# Patient Record
Sex: Male | Born: 1955 | Race: White | Hispanic: No | Marital: Married | Smoking: Never smoker
Health system: Southern US, Community
[De-identification: ages and names within clinical notes are randomized; demographics above are authoritative.]

## PROBLEM LIST (undated history)

## (undated) DIAGNOSIS — C9590 Leukemia, unspecified not having achieved remission: Secondary | ICD-10-CM

## (undated) HISTORY — PX: ARTHROSCOPY KNEE W/ DRILLING: SUR92

## (undated) HISTORY — PX: COLONOSCOPY: SHX174

---

## 2014-02-15 ENCOUNTER — Emergency Department (HOSPITAL_BASED_OUTPATIENT_CLINIC_OR_DEPARTMENT_OTHER)
Admission: EM | Admit: 2014-02-15 | Discharge: 2014-02-15 | Disposition: A | Discharge status: Home / Self Care | Payer: No Typology Code available for payment source | Attending: Emergency Medicine | Admitting: Emergency Medicine

## 2014-02-15 ENCOUNTER — Emergency Department (HOSPITAL_BASED_OUTPATIENT_CLINIC_OR_DEPARTMENT_OTHER): Payer: No Typology Code available for payment source

## 2014-02-15 ENCOUNTER — Encounter (HOSPITAL_BASED_OUTPATIENT_CLINIC_OR_DEPARTMENT_OTHER): Payer: Self-pay | Admitting: Emergency Medicine

## 2014-02-15 DIAGNOSIS — K529 Noninfective gastroenteritis and colitis, unspecified: Secondary | ICD-10-CM | POA: Diagnosis not present

## 2014-02-15 DIAGNOSIS — M791 Myalgia: Secondary | ICD-10-CM | POA: Insufficient documentation

## 2014-02-15 DIAGNOSIS — R197 Diarrhea, unspecified: Secondary | ICD-10-CM

## 2014-02-15 DIAGNOSIS — D696 Thrombocytopenia, unspecified: Secondary | ICD-10-CM | POA: Insufficient documentation

## 2014-02-15 DIAGNOSIS — Z7982 Long term (current) use of aspirin: Secondary | ICD-10-CM | POA: Insufficient documentation

## 2014-02-15 DIAGNOSIS — C9110 Chronic lymphocytic leukemia of B-cell type not having achieved remission: Secondary | ICD-10-CM | POA: Diagnosis not present

## 2014-02-15 HISTORY — DX: Leukemia, unspecified not having achieved remission: C95.90

## 2014-02-15 LAB — COMPREHENSIVE METABOLIC PANEL
ALT: 37 U/L (ref 0–53)
AST: 31 U/L (ref 0–37)
Albumin: 4.5 g/dL (ref 3.5–5.2)
Alkaline Phosphatase: 76 U/L (ref 39–117)
Anion gap: 6 (ref 5–15)
BUN: 21 mg/dL (ref 6–23)
CO2: 23 mmol/L (ref 19–32)
Calcium: 8.9 mg/dL (ref 8.4–10.5)
Chloride: 105 mmol/L (ref 96–112)
Creatinine, Ser: 0.96 mg/dL (ref 0.50–1.35)
GFR calc Af Amer: >90 mL/min (ref >90)
GFR calc non Af Amer: 88 mL/min — ABNORMAL LOW (ref >90)
Glucose, Bld: 156 mg/dL — ABNORMAL HIGH (ref 70–99)
POTASSIUM: 3.5 mmol/L (ref 3.5–5.1)
SODIUM: 134 mmol/L — AB (ref 135–145)
Total Bilirubin: 0.8 mg/dL (ref 0.3–1.2)
Total Protein: 7.5 g/dL (ref 6.0–8.3)

## 2014-02-15 LAB — CBC WITH DIFFERENTIAL/PLATELET
BASOS ABS: 0 10*3/uL (ref 0.0–0.1)
BASOS PCT: 0 % (ref 0–1)
Eosinophils Absolute: 0 10*3/uL (ref 0.0–0.7)
Eosinophils Relative: 0 % (ref 0–5)
HEMATOCRIT: 49 % (ref 39.0–52.0)
HEMOGLOBIN: 16.5 g/dL (ref 13.0–17.0)
Lymphocytes Relative: 52 % — ABNORMAL HIGH (ref 12–46)
Lymphs Abs: 16.3 10*3/uL — ABNORMAL HIGH (ref 0.7–4.0)
MCH: 30.1 pg (ref 26.0–34.0)
MCHC: 33.7 g/dL (ref 30.0–36.0)
MCV: 89.4 fL (ref 78.0–100.0)
MONOS PCT: 3 % (ref 3–12)
Monocytes Absolute: 0.9 10*3/uL (ref 0.1–1.0)
Neutro Abs: 14.1 10*3/uL — ABNORMAL HIGH (ref 1.7–7.7)
Neutrophils Relative %: 45 % (ref 43–77)
Platelets: 117 10*3/uL — ABNORMAL LOW (ref 150–400)
RBC: 5.48 MIL/uL (ref 4.22–5.81)
RDW: 13.7 % (ref 11.5–15.5)
WBC: 31.3 10*3/uL — ABNORMAL HIGH (ref 4.0–10.5)

## 2014-02-15 MED ORDER — SULFAMETHOXAZOLE-TRIMETHOPRIM 800-160 MG PO TABS: 1.0000 | ORAL_TABLET | Freq: Two times a day (BID) | ORAL | Status: DC

## 2014-02-15 MED ORDER — METRONIDAZOLE 500 MG PO TABS
500.0000 mg | ORAL_TABLET | Freq: Once | ORAL | Status: AC
  Administered 2014-02-15: 500 mg via ORAL
  Filled 2014-02-15: qty 1

## 2014-02-15 MED ORDER — DICYCLOMINE HCL 10 MG/ML IM SOLN
20.0000 mg | Freq: Once | INTRAMUSCULAR | Status: AC
  Administered 2014-02-15: 20 mg via INTRAMUSCULAR
  Filled 2014-02-15: qty 2

## 2014-02-15 MED ORDER — METRONIDAZOLE 500 MG PO TABS: 500.0000 mg | ORAL_TABLET | Freq: Three times a day (TID) | ORAL | Status: DC

## 2014-02-15 MED ORDER — SODIUM CHLORIDE 0.9 % IV BOLUS (SEPSIS)
1000.0000 mL | Freq: Once | INTRAVENOUS | Status: AC
  Administered 2014-02-15: 1000 mL via INTRAVENOUS

## 2014-02-15 MED ORDER — ACETAMINOPHEN 500 MG PO TABS
1000.0000 mg | ORAL_TABLET | Freq: Once | ORAL | Status: AC
  Administered 2014-02-15: 1000 mg via ORAL
  Filled 2014-02-15: qty 2

## 2014-02-15 MED ORDER — KETOROLAC TROMETHAMINE 30 MG/ML IJ SOLN
30.0000 mg | Freq: Once | INTRAMUSCULAR | Status: AC
  Administered 2014-02-15: 30 mg via INTRAVENOUS
  Filled 2014-02-15: qty 1

## 2014-02-15 MED ORDER — MORPHINE SULFATE 2 MG/ML IJ SOLN
2.0000 mg | Freq: Once | INTRAMUSCULAR | Status: AC
  Administered 2014-02-15: 2 mg via INTRAVENOUS
  Filled 2014-02-15: qty 1

## 2014-02-15 MED ORDER — IOHEXOL 300 MG/ML  SOLN
100.0000 mL | Freq: Once | INTRAMUSCULAR | Status: AC | PRN
  Administered 2014-02-15: 100 mL via INTRAVENOUS

## 2014-02-15 MED ORDER — IOHEXOL 300 MG/ML  SOLN
50.0000 mL | Freq: Once | INTRAMUSCULAR | Status: AC | PRN
  Administered 2014-02-15: 50 mL via ORAL

## 2014-02-15 MED ORDER — HYDROCODONE-ACETAMINOPHEN 5-325 MG PO TABS: 1.0000 | ORAL_TABLET | Freq: Four times a day (QID) | ORAL | Status: AC | PRN

## 2014-02-15 NOTE — ED Notes (Signed)
Second liter 0.9 nss just finishing 3rd liter infusing

## 2014-02-15 NOTE — ED Provider Notes (Addendum)
CSN: [id number]     Arrival date & time 02/15/14  0405 History   None    Chief Complaint  Patient presents with  . Diarrhea     (Consider location/radiation/quality/duration/timing/severity/associated sxs/prior Treatment) Patient is a 59 y.o. male presenting with diarrhea. The history is provided by the patient.  Diarrhea Quality:  Watery Severity:  Moderate Onset quality:  Gradual Timing:  Intermittent Progression:  Unchanged Relieved by:  Nothing Worsened by:  Nothing tried Ineffective treatments:  None tried Associated symptoms: myalgias   Associated symptoms: no fever and no vomiting   Risk factors: no recent antibiotic use and no travel to endemic areas     Past Medical History  Diagnosis Date  . Leukemia   . Leukemia chronic leukemia   Past Surgical History  Procedure Laterality Date  . Arthroscopy knee w/ drilling     History reviewed. No pertinent family history. History  Substance Use Topics  . Smoking status: Never Smoker   . Smokeless tobacco: Never Used  . Alcohol Use: Yes    Review of Systems  Constitutional: Negative for fever.  Respiratory: Negative for cough, shortness of breath and wheezing.   Cardiovascular: Negative for chest pain, palpitations and leg swelling.  Gastrointestinal: Positive for diarrhea. Negative for vomiting.  Genitourinary: Negative for dysuria, frequency, flank pain and difficulty urinating.  Musculoskeletal: Positive for myalgias.  All other systems reviewed and are negative.     Allergies  Avelox  Home Medications   Prior to Admission medications   Medication Sig Start Date End Date Taking? Authorizing Provider  aspirin 81 MG tablet Take 81 mg by mouth daily.   Yes Historical Provider, MD  tamsulosin (FLOMAX) 0.4 MG CAPS capsule Take 0.4 mg by mouth.   Yes Historical Provider, MD   BP 150/83 mmHg  Pulse 115  Temp(Src) 101.2 F (38.4 C) (Oral)  Resp 20  Ht 6\' 2"  (1.88 m)  Wt 230 lb (104.327 kg)  BMI 29.52  kg/m2  SpO2 96% Physical Exam  Constitutional: He is oriented to person, place, and time. He appears well-developed and well-nourished. No distress.  HENT:  Head: Normocephalic and atraumatic.  Mouth/Throat: Oropharynx is clear and moist. No oropharyngeal exudate.  Eyes: Conjunctivae and EOM are normal. Pupils are equal, round, and reactive to light.  Neck: Normal range of motion. Neck supple.  Cardiovascular: Normal rate, regular rhythm and intact distal pulses.   Pulmonary/Chest: Effort normal and breath sounds normal. No respiratory distress. He has no wheezes. He has no rales.  Abdominal: Soft. Bowel sounds are increased. There is no tenderness. There is no rebound and no guarding.  Musculoskeletal: Normal range of motion.  Neurological: He is alert and oriented to person, place, and time.  Skin: Skin is warm and dry.  Psychiatric: He has a normal mood and affect.    ED Course  Procedures (including critical care time) Labs Review Labs Reviewed  CBC WITH DIFFERENTIAL/PLATELET  COMPREHENSIVE METABOLIC PANEL    Imaging Review No results found.   EKG Interpretation None      MDM   Final diagnoses:  None    Medications  sodium chloride 0.9 % bolus 1,000 mL (1,000 mLs Intravenous New Bag/Given 02/15/14 0545)  sodium chloride 0.9 % bolus 1,000 mL (not administered)  acetaminophen (TYLENOL) tablet 1,000 mg (1,000 mg Oral Given 02/15/14 0501)  sodium chloride 0.9 % bolus 1,000 mL (1,000 mLs Intravenous New Bag/Given 02/15/14 0502)  dicyclomine (BENTYL) injection 20 mg (20 mg Intramuscular Given 02/15/14 0502)  morphine 2 MG/ML injection 2 mg (2 mg Intravenous Given 02/15/14 0545)  iohexol (OMNIPAQUE) 300 MG/ML solution 50 mL (50 mLs Oral Contrast Given 02/15/14 0510)  iohexol (OMNIPAQUE) 300 MG/ML solution 100 mL (100 mLs Intravenous Contrast Given 02/15/14 0554)  Patient did not originally tell EDP about his chronic leukemia, any surgeries or his recent colonoscopy     Patient has a h/o off Chronic leukemia this is of the white blood cell line making difficult to differentiate acute phase reactant of illness vs. Ongoing process.  Furthermore, suspect patient's diet has prolonged the diarrhea as patient has been eating pizza and [place] for the last 2 days which are both greasy and spicy.  Patient is well appearing.  In fact is main complaint is that he has not slept this evening.  We are treating his pain and free water loss with fluids.  No emesis.    Patient had a recent reportedly normal colonoscopy.  We will start flagyl for colitis and recommend close follow up including stool studies with your gastroenterologist.  Patient and wife verbalize understanding and agree to follow up  Will recommend bland diet and food choices to help alleviate diarrhea as well as yogurt with active cultures to slow diarrhea.  Recommend recheck with your doctor in 48 hours.      Carlisle Beers, MD 02/15/14 4037  Santino Kinsella K Amely Voorheis-Rasch, MD 02/15/14 5436  Carlisle Beers, MD 02/15/14 [phone]

## 2014-02-15 NOTE — ED Notes (Signed)
Wife to nurse station to question why pt was having CT abd and informed this Probation officer that her husband had recently had colonoscopy by Dr Truman Hayward at [place] or [place] endoscopy which they failed to mention during questioning at triage. They were asked to include all procedures when being triaged.

## 2014-02-15 NOTE — ED Notes (Signed)
Pt reports diarrhea stools for the last 2 days with greater than ten episodes in the last 24 hours also reports abd pain admits to nausea but denies having had emesis

## 2018-01-27 ENCOUNTER — Encounter (HOSPITAL_BASED_OUTPATIENT_CLINIC_OR_DEPARTMENT_OTHER): Payer: Self-pay | Admitting: Emergency Medicine

## 2018-01-27 ENCOUNTER — Emergency Department (HOSPITAL_BASED_OUTPATIENT_CLINIC_OR_DEPARTMENT_OTHER): Payer: Managed Care, Other (non HMO)

## 2018-01-27 ENCOUNTER — Emergency Department (HOSPITAL_BASED_OUTPATIENT_CLINIC_OR_DEPARTMENT_OTHER)
Admission: EM | Admit: 2018-01-27 | Discharge: 2018-01-27 | Disposition: A | Discharge status: Home / Self Care | Payer: Managed Care, Other (non HMO) | Attending: Emergency Medicine | Admitting: Emergency Medicine

## 2018-01-27 DIAGNOSIS — Z7982 Long term (current) use of aspirin: Secondary | ICD-10-CM | POA: Diagnosis not present

## 2018-01-27 DIAGNOSIS — R109 Unspecified abdominal pain: Secondary | ICD-10-CM

## 2018-01-27 DIAGNOSIS — N23 Unspecified renal colic: Secondary | ICD-10-CM

## 2018-01-27 DIAGNOSIS — Z856 Personal history of leukemia: Secondary | ICD-10-CM | POA: Insufficient documentation

## 2018-01-27 DIAGNOSIS — R1032 Left lower quadrant pain: Secondary | ICD-10-CM | POA: Diagnosis present

## 2018-01-27 LAB — BASIC METABOLIC PANEL
Anion gap: 8 (ref 5–15)
BUN: 26 mg/dL — ABNORMAL HIGH (ref 8–23)
CO2: 21 mmol/L — AB (ref 22–32)
Calcium: 8.6 mg/dL — ABNORMAL LOW (ref 8.9–10.3)
Chloride: 109 mmol/L (ref 98–111)
Creatinine, Ser: 1.17 mg/dL (ref 0.61–1.24)
GFR calc non Af Amer: >60 mL/min (ref >60)
Glucose, Bld: 161 mg/dL — ABNORMAL HIGH (ref 70–99)
Potassium: 4 mmol/L (ref 3.5–5.1)
Sodium: 138 mmol/L (ref 135–145)

## 2018-01-27 LAB — CBC WITH DIFFERENTIAL/PLATELET
Abs Immature Granulocytes: 0.19 10*3/uL — ABNORMAL HIGH (ref 0.00–0.07)
BASOS PCT: 0 %
Basophils Absolute: 0.1 10*3/uL (ref 0.0–0.1)
EOS ABS: 0.1 10*3/uL (ref 0.0–0.5)
EOS PCT: 0 %
HCT: 47.9 % (ref 39.0–52.0)
HEMOGLOBIN: 15.1 g/dL (ref 13.0–17.0)
Immature Granulocytes: 0 %
LYMPHS ABS: 57.6 10*3/uL — AB (ref 0.7–4.0)
Lymphocytes Relative: 86 %
MCH: 30.4 pg (ref 26.0–34.0)
MCHC: 31.5 g/dL (ref 30.0–36.0)
MCV: 96.4 fL (ref 80.0–100.0)
MONO ABS: 0.7 10*3/uL (ref 0.1–1.0)
MONOS PCT: 1 %
NEUTROS ABS: 9 10*3/uL — AB (ref 1.7–7.7)
NRBC: 0 % (ref 0.0–0.2)
Neutrophils Relative %: 13 %
Platelets: 121 10*3/uL — ABNORMAL LOW (ref 150–400)
RBC: 4.97 MIL/uL (ref 4.22–5.81)
RDW: 14 % (ref 11.5–15.5)
Smear Review: NORMAL
WBC: 67.6 10*3/uL (ref 4.0–10.5)

## 2018-01-27 LAB — URINALYSIS, ROUTINE W REFLEX MICROSCOPIC
BILIRUBIN URINE: NEGATIVE
Glucose, UA: NEGATIVE mg/dL
Ketones, ur: NEGATIVE mg/dL
Leukocytes, UA: NEGATIVE
Nitrite: NEGATIVE
Protein, ur: NEGATIVE mg/dL
Specific Gravity, Urine: >1.03 — ABNORMAL HIGH (ref 1.005–1.030)
pH: 5.5 (ref 5.0–8.0)

## 2018-01-27 LAB — URINALYSIS, MICROSCOPIC (REFLEX)

## 2018-01-27 MED ORDER — SODIUM CHLORIDE 0.9 % IV BOLUS
1000.0000 mL | Freq: Once | INTRAVENOUS | Status: AC
  Administered 2018-01-27: 1000 mL via INTRAVENOUS

## 2018-01-27 MED ORDER — HYDROMORPHONE HCL 1 MG/ML IJ SOLN
1.0000 mg | Freq: Once | INTRAMUSCULAR | Status: AC
  Administered 2018-01-27: 1 mg via INTRAVENOUS
  Filled 2018-01-27: qty 1

## 2018-01-27 MED ORDER — OXYCODONE-ACETAMINOPHEN 5-325 MG PO TABS
1.0000 | ORAL_TABLET | Freq: Once | ORAL | Status: AC
  Administered 2018-01-27: 1 via ORAL
  Filled 2018-01-27: qty 1

## 2018-01-27 MED ORDER — ONDANSETRON 4 MG PO TBDP: 4.0000 mg | ORAL_TABLET | Freq: Three times a day (TID) | ORAL | 0 refills | Status: DC | PRN

## 2018-01-27 MED ORDER — KETOROLAC TROMETHAMINE 30 MG/ML IJ SOLN
15.0000 mg | Freq: Once | INTRAMUSCULAR | Status: AC
  Administered 2018-01-27: 15 mg via INTRAVENOUS
  Filled 2018-01-27: qty 1

## 2018-01-27 MED ORDER — ONDANSETRON 4 MG PO TBDP
4.0000 mg | ORAL_TABLET | Freq: Once | ORAL | Status: AC
  Administered 2018-01-27: 4 mg via ORAL
  Filled 2018-01-27: qty 1

## 2018-01-27 MED ORDER — ONDANSETRON HCL 4 MG/2ML IJ SOLN
4.0000 mg | Freq: Once | INTRAMUSCULAR | Status: AC
  Administered 2018-01-27: 4 mg via INTRAVENOUS
  Filled 2018-01-27: qty 2

## 2018-01-27 MED ORDER — TAMSULOSIN HCL 0.4 MG PO CAPS: 0.4000 mg | ORAL_CAPSULE | Freq: Every day | ORAL | 0 refills | Status: DC

## 2018-01-27 MED ORDER — OXYCODONE-ACETAMINOPHEN 5-325 MG PO TABS: 1.0000 | ORAL_TABLET | ORAL | 0 refills | Status: DC | PRN

## 2018-01-27 NOTE — ED Notes (Signed)
Attempted to complete patient screening, refused and stated "how 'bout a doctor, come on let's go."

## 2018-01-27 NOTE — Discharge Instructions (Addendum)
Take the pain and nausea medication as prescribed and follow-up with your urologist Dr. Nevada Crane.  Return to the ED with worsening pain, nausea, vomiting or fever.  Also let Dr. Harlow Asa know about your elevated white blood cell count and need to have this rechecked after your kidney stone pain has resolved.

## 2018-01-27 NOTE — ED Notes (Signed)
Bladder scan: 90mL

## 2018-01-27 NOTE — ED Notes (Signed)
ED Provider at bedside. 

## 2018-01-27 NOTE — ED Triage Notes (Signed)
Pt arrives with L sided flank pain x3 hours, reports unable to void. Hx leukemia. Emesis x3

## 2018-01-27 NOTE — ED Notes (Signed)
Reminded patient of need for urine sample 

## 2018-01-27 NOTE — ED Provider Notes (Addendum)
[facility] EMERGENCY DEPARTMENT Provider Note   CSN: [id number] Arrival date & time: 01/27/18  0446     History   Chief Complaint Chief Complaint  Patient presents with  . Flank Pain    HPI Ronnie Campbell is a 63 y.o. male.  Patient in distress from pain.  Complains of left-sided flank and abdominal pain ongoing for the past 3 hours.  States it woke him from sleep.  Associated with nausea and vomiting and chills.  No documented fever.  States he feels like he needs to urinate but cannot.  Last episode of urination was several hours ago.  Felt well when he went to bed.  Was told he had a kidney stone on CT scan but is never passed one.  Denies any pain with urination or blood in the urine.  Pain does not radiate down his back.  No focal weakness, numbness or tingling.  No testicular pain.  No chest pain or shortness of breath.  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  The history is provided by the patient.  Flank Pain  Associated symptoms include abdominal pain. Pertinent negatives include no chest pain and no headaches.    Past Medical History:  Diagnosis Date  . Leukemia ([place])   . Leukemia ([place]) chronic leukemia    There are no active problems to display for this patient.   Past Surgical History:  Procedure Laterality Date  . ARTHROSCOPY KNEE W/ DRILLING    . COLONOSCOPY          Home Medications    Prior to Admission medications   Medication Sig Start Date End Date Taking? Authorizing Provider  aspirin 81 MG tablet Take 81 mg by mouth daily.    [provider]  HYDROcodone-acetaminophen (NORCO) 5-325 MG per tablet Take 1 tablet by mouth every 6 (six) hours as needed for severe pain. 02/15/14   Palumbo, April, MD  metroNIDAZOLE (FLAGYL) 500 MG tablet Take 1 tablet (500 mg total) by mouth 3 (three) times daily. One po bid x `0 days 02/15/14   Palumbo, April, MD  sulfamethoxazole-trimethoprim (SEPTRA DS) 800-160 MG per tablet Take 1  tablet by mouth 2 (two) times daily. 02/15/14   Palumbo, April, MD  tamsulosin (FLOMAX) 0.4 MG CAPS capsule Take 0.4 mg by mouth.    [provider]    Family History History reviewed. No pertinent family history.  Social History Social History   Tobacco Use  . Smoking status: Never Smoker  . Smokeless tobacco: Never Used  Substance Use Topics  . Alcohol use: Yes  . Drug use: No     Allergies   Avelox [moxifloxacin hcl in nacl]   Review of Systems Review of Systems  Constitutional: Positive for chills and fatigue. Negative for activity change and appetite change.  HENT: Negative for congestion.   Respiratory: Negative for chest tightness.   Cardiovascular: Negative for chest pain.  Gastrointestinal: Positive for abdominal pain, nausea and vomiting.  Genitourinary: Positive for decreased urine volume, difficulty urinating, dysuria, flank pain and urgency. Negative for testicular pain.  Musculoskeletal: Positive for back pain. Negative for arthralgias and myalgias.  Neurological: Negative for dizziness, weakness and headaches.   all other systems are negative except as noted in the HPI and PMH.     Physical Exam Updated Vital Signs BP (!) 170/93   Pulse (!) 53   Resp 18   Ht 6\' 2"  (1.88 m)   Wt 113.4 kg   SpO2 100%  BMI 32.10 kg/m   Physical Exam Vitals signs and nursing note reviewed.  Constitutional:      General: He is not in acute distress.    Appearance: He is well-developed. He is diaphoretic.     Comments: Uncomfortable  HENT:     Head: Normocephalic and atraumatic.     Mouth/Throat:     Pharynx: No oropharyngeal exudate.  Eyes:     Conjunctiva/sclera: Conjunctivae normal.     Pupils: Pupils are equal, round, and reactive to light.  Neck:     Musculoskeletal: Normal range of motion and neck supple.     Comments: No meningismus. Cardiovascular:     Rate and Rhythm: Normal rate and regular rhythm.     Heart sounds: Normal heart sounds. No  murmur.  Pulmonary:     Effort: Pulmonary effort is normal. No respiratory distress.     Breath sounds: Normal breath sounds.  Abdominal:     Palpations: Abdomen is soft.     Tenderness: There is no abdominal tenderness. There is no guarding or rebound.     Comments: Left-sided abdominal tenderness with voluntary guarding Equal femoral pulses  Genitourinary:    Comments: No testicular tenderness Musculoskeletal: Normal range of motion.        General: No tenderness.     Comments: No CVA tenderness.  Intact DP and PT pulses  Skin:    General: Skin is warm.  Neurological:     Mental Status: He is alert and oriented to person, place, and time.     Cranial Nerves: No cranial nerve deficit.     Motor: No abnormal muscle tone.     Coordination: Coordination normal.     Comments: No ataxia on finger to nose bilaterally. No pronator drift. 5/5 strength throughout. CN 2-12 intact.Equal grip strength. Sensation intact.   Psychiatric:        Behavior: Behavior normal.      ED Treatments / Results  Labs (all labs ordered are listed, but only abnormal results are displayed) Labs Reviewed  CBC WITH DIFFERENTIAL/PLATELET - Abnormal; Notable for the following components:      Result Value   WBC 67.6 (*)    Platelets 121 (*)    Neutro Abs 9.0 (*)    Lymphs Abs 57.6 (*)    Abs Immature Granulocytes 0.19 (*)    All other components within normal limits  BASIC METABOLIC PANEL - Abnormal; Notable for the following components:   CO2 21 (*)    Glucose, Bld 161 (*)    BUN 26 (*)    Calcium 8.6 (*)    All other components within normal limits  URINALYSIS, ROUTINE W REFLEX MICROSCOPIC - Abnormal; Notable for the following components:   Specific Gravity, Urine >1.030 (*)    Hgb urine dipstick LARGE (*)    All other components within normal limits  URINALYSIS, MICROSCOPIC (REFLEX) - Abnormal; Notable for the following components:   Bacteria, UA RARE (*)    All other components within normal  limits  URINE CULTURE    EKG None  Radiology Ct Renal Stone Study  Result Date: 01/27/2018 CLINICAL DATA:  Initial evaluation for acute left flank pain. EXAM: CT ABDOMEN AND PELVIS WITHOUT CONTRAST TECHNIQUE: Multidetector CT imaging of the abdomen and pelvis was performed following the standard protocol without IV contrast. COMPARISON:  Prior CT from 09/20/2017. FINDINGS: Lower chest: Scattered atelectatic changes seen dependently within the visualized lung bases. 3 mm subpleural nodule present within the right middle lobe (  series 3, image 4), indeterminate. Trace pericardial effusion noted. Hepatobiliary: Limited noncontrast evaluation of the liver is unremarkable. Gallbladder within normal limits. No biliary dilatation. Pancreas: Pancreas within normal limits. Spleen: Spleen within normal limits. Adrenals/Urinary Tract: Adrenal glands are normal. There is a 1 cm obstructive stone lodged at the left UVJ with secondary moderate left hydroureteronephrosis. Prominent left perinephric and periureteral fat stranding. Additional punctate 3 mm nonobstructive stone present within the lower pole the left kidney. No other left-sided renal calculi identified. No discernible right renal calculi. Subcentimeter hypodensity at the upper pole right kidney noted (series 2, image 28), indeterminate, but could reflect a small proteinaceous and/or hemorrhagic cyst. Bladder largely decompressed. Diffuse circumferential bladder wall thickening likely related incomplete distension. No other layering stones within the bladder lumen. Stomach/Bowel: Stomach within normal limits. No evidence for bowel obstruction. Normal appendix. No acute inflammatory changes seen about the bowels. Vascular/Lymphatic: Mild-to-moderate atherosclerosis within the intra-abdominal aorta. No aneurysm. No adenopathy. Reproductive: Prostate within normal limits. Other: Fat containing left inguinal hernia noted. No free air or fluid. Musculoskeletal: No  acute osseous abnormality. No discrete lytic or blastic osseous lesions. IMPRESSION: 1. 1 cm obstructive stone at the left UVJ with secondary moderate left hydroureteronephrosis. 2. Additional punctate nonobstructive left renal nephrolithiasis. 3. No other acute intra-abdominal or pelvic process. 4. 3 mm subpleural right middle lobe nodule, indeterminate. No follow-up needed if patient is low-risk. Non-contrast chest CT can be considered in 12 months if patient is high-risk. This recommendation follows the consensus statement: Guidelines for Management of Incidental Pulmonary Nodules Detected on CT Images: From the Fleischner Society 2017; Radiology 2017; 284:228-243. Electronically Signed   By: Jeannine Boga M.D.   On: 01/27/2018 06:55    Procedures Procedures (including critical care time)  Medications Ordered in ED Medications  ondansetron (ZOFRAN) injection 4 mg (has no administration in time range)  sodium chloride 0.9 % bolus 1,000 mL (1,000 mLs Intravenous New Bag/Given 01/27/18 0524)  HYDROmorphone (DILAUDID) injection 1 mg (1 mg Intravenous Given 01/27/18 0525)  ketorolac (TORADOL) 30 MG/ML injection 15 mg (15 mg Intravenous Given 01/27/18 0525)     Initial Impression / Assessment and Plan / ED Course  I have reviewed the triage vital signs and the nursing notes.  Pertinent labs & imaging results that were available during my care of the patient were reviewed by me and considered in my medical decision making (see chart for details).      Flank pain with urinary symptoms and nausea and vomiting.  Suspect kidney stone.  Leukocytosis and lymphocytosis noted.  Last values available in care everywhere were white blood cell count of 55 with 55% lymphocytes in June 2018. No fever.  CT with L UVJ stone with hydronephrosis. Cr stable.  UA without infection. On recheck, pain and nausea have improved.   Results d/w patient and wife. He was supposed to have followup with his  hematologist Dr. Harlow Asa today. They will make him aware of his labs today. Suspect some element of stress response from renal colic.  D/w Dr. Harlow Asa by phone who agrees and states likely stress response and less likely progression of CLL. He will followup with patient in office.  Follow up with his urologist Dr. Nevada Crane in [place]. Return precautions discussed including worsening pain, vomiting or fever. Urine culture sent. Dr. Maryan Rued to assume patient tolerating PO and pain controlled before discharge.  Final Clinical Impressions(s) / ED Diagnoses   Final diagnoses:  Left flank pain  Renal colic  ED Discharge Orders    None       Keishla Oyer, Annie Main, MD 01/27/18 7023    Ezequiel Essex, MD 01/27/18 1026

## 2018-01-28 LAB — URINE CULTURE: Culture: NO GROWTH

## 2018-05-07 ENCOUNTER — Other Ambulatory Visit: Payer: Self-pay | Admitting: Urology

## 2018-05-07 DIAGNOSIS — C61 Malignant neoplasm of prostate: Secondary | ICD-10-CM

## 2018-05-21 ENCOUNTER — Other Ambulatory Visit: Payer: Self-pay

## 2018-05-21 ENCOUNTER — Ambulatory Visit
Admission: RE | Admit: 2018-05-21 | Discharge: 2018-05-21 | Disposition: A | Discharge status: Home / Self Care | Payer: Managed Care, Other (non HMO) | Source: Ambulatory Visit | Attending: Urology | Admitting: Urology

## 2018-05-21 DIAGNOSIS — C61 Malignant neoplasm of prostate: Secondary | ICD-10-CM

## 2018-05-21 MED ORDER — GADOBENATE DIMEGLUMINE 529 MG/ML IV SOLN
20.0000 mL | Freq: Once | INTRAVENOUS | Status: AC | PRN
  Administered 2018-05-21: 09:00:00 20 mL via INTRAVENOUS

## 2020-03-01 DIAGNOSIS — C9110 Chronic lymphocytic leukemia of B-cell type not having achieved remission: Secondary | ICD-10-CM | POA: Diagnosis not present

## 2020-03-10 DIAGNOSIS — D696 Thrombocytopenia, unspecified: Secondary | ICD-10-CM | POA: Diagnosis not present

## 2020-03-10 DIAGNOSIS — E119 Type 2 diabetes mellitus without complications: Secondary | ICD-10-CM | POA: Diagnosis not present

## 2020-03-10 DIAGNOSIS — C9110 Chronic lymphocytic leukemia of B-cell type not having achieved remission: Secondary | ICD-10-CM | POA: Diagnosis not present

## 2020-03-10 DIAGNOSIS — E785 Hyperlipidemia, unspecified: Secondary | ICD-10-CM | POA: Diagnosis not present

## 2020-03-24 DIAGNOSIS — M25521 Pain in right elbow: Secondary | ICD-10-CM | POA: Diagnosis not present

## 2020-04-05 DIAGNOSIS — C61 Malignant neoplasm of prostate: Secondary | ICD-10-CM | POA: Diagnosis not present

## 2020-04-05 DIAGNOSIS — N138 Other obstructive and reflux uropathy: Secondary | ICD-10-CM | POA: Diagnosis not present

## 2020-04-05 DIAGNOSIS — R338 Other retention of urine: Secondary | ICD-10-CM | POA: Diagnosis not present

## 2020-04-05 DIAGNOSIS — N401 Enlarged prostate with lower urinary tract symptoms: Secondary | ICD-10-CM | POA: Diagnosis not present

## 2020-04-05 DIAGNOSIS — R339 Retention of urine, unspecified: Secondary | ICD-10-CM | POA: Diagnosis not present

## 2020-04-07 DIAGNOSIS — R339 Retention of urine, unspecified: Secondary | ICD-10-CM | POA: Diagnosis not present

## 2020-04-07 DIAGNOSIS — N281 Cyst of kidney, acquired: Secondary | ICD-10-CM | POA: Diagnosis not present

## 2020-04-20 DIAGNOSIS — C61 Malignant neoplasm of prostate: Secondary | ICD-10-CM | POA: Diagnosis not present

## 2020-04-20 DIAGNOSIS — R338 Other retention of urine: Secondary | ICD-10-CM | POA: Diagnosis not present

## 2021-03-14 IMAGING — MR MR PROSTATE WO/W CM
56 series · 56 of 56 positions shown · IV contrast (20 ml multihance)
Comparison: CT 01/27/2018

CLINICAL DATA: Prostate cancer.  Gleason 3+3=6 ([DATE]).

EXAM:
MR PROSTATE WITHOUT AND WITH CONTRAST
TECHNIQUE: Multiplanar multisequence MRI images were obtained of the pelvis
centered about the prostate. Pre and post contrast images were
obtained.
CONTRAST:  20mL MULTIHANCE GADOBENATE DIMEGLUMINE 529 MG/ML IV SOLN
Creatinine was obtained on site at [HOSPITAL] at [HOSPITAL].
Results: Creatinine 1.0 mg/dL.

[Series 3: new loc · axial · 6.0mm · 0.88mm/px · 1 of 42 slices shown]
[im 1/42]
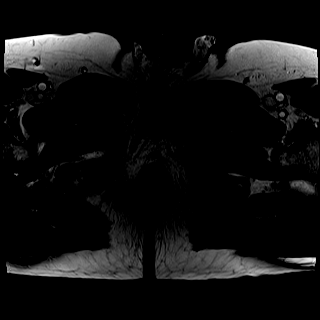

[Series 4: T1 · axial · 8.0mm · 1.06mm/px · 1 of 28 slices shown (1 of 2)]
[im 1/28]
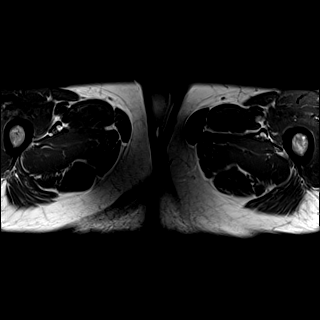

[Series 5: bSSFP fat-sat · axial · 8.0mm · 0.74mm/px · 1 of 28 slices shown]
[im 1/28]
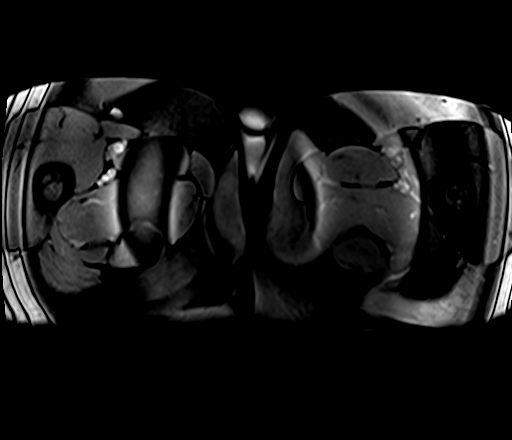

[Series 6: T2 · sagittal · 3.5mm · 0.56mm/px · 1 of 39 slices shown (1 of 4)]
[im 1/39]
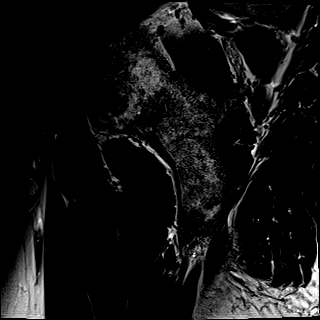

[Series 7: T2 · coronal · 3.5mm · 0.56mm/px · 1 of 23 slices shown (2 of 4)]
[im 1/23]
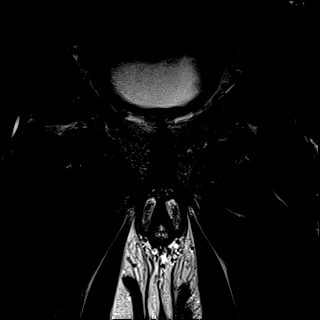

[Series 8: T1 · axial · 3.0mm · 0.31mm/px · 1 of 24 slices shown (2 of 2)]
[im 1/24]
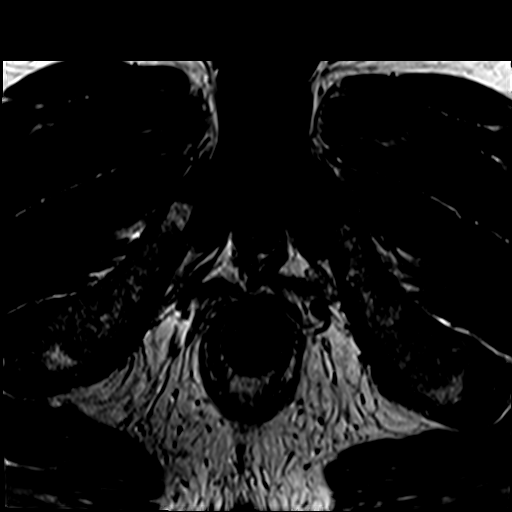

[Series 9: T2 · axial · 3.5mm · 0.56mm/px · 1 of 23 slices shown (3 of 4)]
[im 1/23]
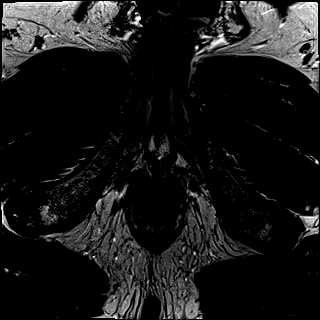

[Series 10: T2 · axial · 1.0mm · 1.04mm/px · 1 of 80 slices shown (4 of 4)]
[im 1/80]
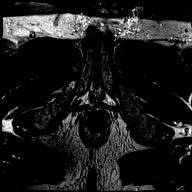

[Series 11: DWI · axial · 3.5mm · 1.56mm/px · 1 of 60 slices shown (1 of 2)]
[im 1/60]
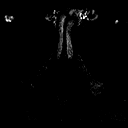

[Series 12: DWI · axial · 3.5mm · 1.56mm/px · 1 of 19 slices shown (2 of 2)]
[im 1/19]
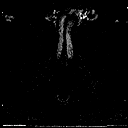

[Series 13: pre t1_twist_tra_dyn_ttc=5.3s · axial · non-contrast · 3.5mm · 0.83mm/px · 1 of 20 slices shown]
[im 1/20]
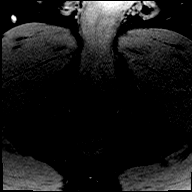

[Series 14: post t1_twist_tra_dyn-copy center · axial · 3.5mm · 0.83mm/px · 1 of 20 slices shown (1 of 23)]
[im 1/20]
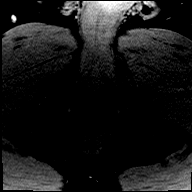

[Series 15: post t1_twist_tra_dyn-copy center · axial · 3.5mm · 0.83mm/px · 1 of 20 slices shown (2 of 23)]
[im 1/20]
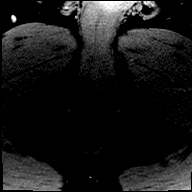

[Series 16: post t1_twist_tra_dyn-copy cent_sub_ttc=(id) · axial · 3.5mm · 0.83mm/px · 1 of 7 slices shown (1 of 22)]
[im 1/7]
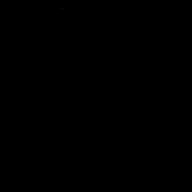

[Series 17: post t1_twist_tra_dyn-copy center · axial · 3.5mm · 0.83mm/px · 1 of 20 slices shown (3 of 23)]
[im 1/20]
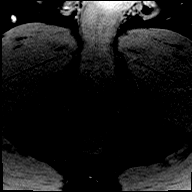

[Series 18: post t1_twist_tra_dyn-copy cent_sub_ttc=(id) · axial · 3.5mm · 0.83mm/px · 1 of 15 slices shown (2 of 22)]
[im 1/15]
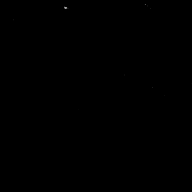

[Series 19: post t1_twist_tra_dyn-copy center · axial · 3.5mm · 0.83mm/px · 1 of 20 slices shown (4 of 23)]
[im 1/20]
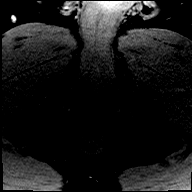

[Series 20: post t1_twist_tra_dyn-copy cent_sub_ttc=(id) · axial · 3.5mm · 0.83mm/px · 1 of 15 slices shown (3 of 22)]
[im 1/15]
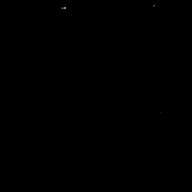

[Series 21: post t1_twist_tra_dyn-copy center · axial · 3.5mm · 0.83mm/px · 1 of 20 slices shown (5 of 23)]
[im 1/20]
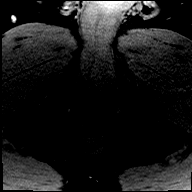

[Series 22: post t1_twist_tra_dyn-copy cent_sub_ttc=(id) · axial · 3.5mm · 0.83mm/px · 1 of 18 slices shown (4 of 22)]
[im 1/18]
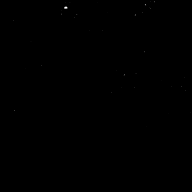

[Series 23: post t1_twist_tra_dyn-copy center · axial · 3.5mm · 0.83mm/px · 1 of 20 slices shown (6 of 23)]
[im 1/20]
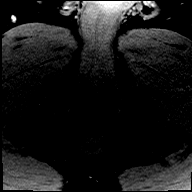

[Series 24: post t1_twist_tra_dyn-copy cent_sub_ttc=(id) · axial · 3.5mm · 0.83mm/px · 1 of 17 slices shown (5 of 22)]
[im 1/17]
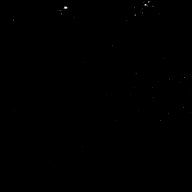

[Series 25: post t1_twist_tra_dyn-copy center · axial · 3.5mm · 0.83mm/px · 1 of 20 slices shown (7 of 23)]
[im 1/20]
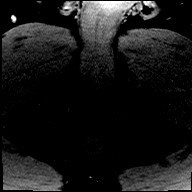

[Series 26: post t1_twist_tra_dyn-copy cent_sub_ttc=(id) · axial · 3.5mm · 0.83mm/px · 1 of 20 slices shown (6 of 22)]
[im 1/20]
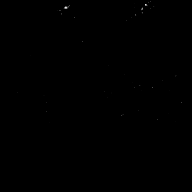

[Series 27: post t1_twist_tra_dyn-copy center · axial · 3.5mm · 0.83mm/px · 1 of 20 slices shown (8 of 23)]
[im 1/20]
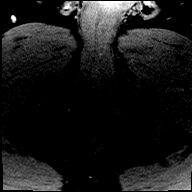

[Series 28: post t1_twist_tra_dyn-copy cent_sub_ttc=(id) · axial · 3.5mm · 0.83mm/px · 1 of 20 slices shown (7 of 22)]
[im 1/20]
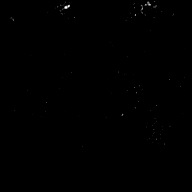

[Series 29: post t1_twist_tra_dyn-copy center · axial · 3.5mm · 0.83mm/px · 1 of 20 slices shown (9 of 23)]
[im 1/20]
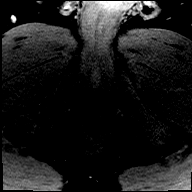

[Series 30: post t1_twist_tra_dyn-copy cent_sub_ttc=(id) · axial · 3.5mm · 0.83mm/px · 1 of 20 slices shown (8 of 22)]
[im 1/20]
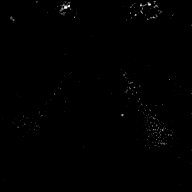

[Series 31: post t1_twist_tra_dyn-copy center · axial · 3.5mm · 0.83mm/px · 1 of 20 slices shown (10 of 23)]
[im 1/20]
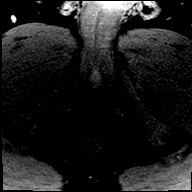

[Series 32: post t1_twist_tra_dyn-copy cent_sub_ttc=(id) · axial · 3.5mm · 0.83mm/px · 1 of 20 slices shown (9 of 22)]
[im 1/20]
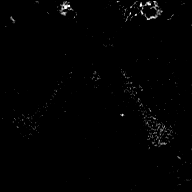

[Series 33: post t1_twist_tra_dyn-copy center · axial · 3.5mm · 0.83mm/px · 1 of 20 slices shown (11 of 23)]
[im 1/20]
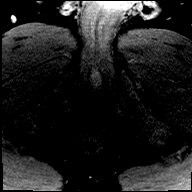

[Series 34: post t1_twist_tra_dyn-copy cent_sub_ttc=(id) · axial · 3.5mm · 0.83mm/px · 1 of 20 slices shown (10 of 22)]
[im 1/20]
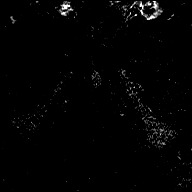

[Series 35: post t1_twist_tra_dyn-copy center · axial · 3.5mm · 0.83mm/px · 1 of 20 slices shown (12 of 23)]
[im 1/20]
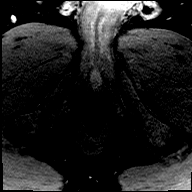

[Series 36: post t1_twist_tra_dyn-copy cent_sub_ttc=(id) · axial · 3.5mm · 0.83mm/px · 1 of 20 slices shown (11 of 22)]
[im 1/20]
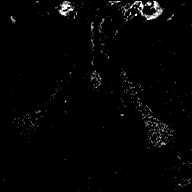

[Series 37: post t1_twist_tra_dyn-copy center · axial · 3.5mm · 0.83mm/px · 1 of 20 slices shown (13 of 23)]
[im 1/20]
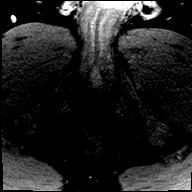

[Series 38: post t1_twist_tra_dyn-copy cent_sub_ttc=(id) · axial · 3.5mm · 0.83mm/px · 1 of 20 slices shown (12 of 22)]
[im 1/20]
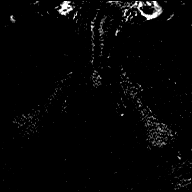

[Series 39: post t1_twist_tra_dyn-copy center · axial · 3.5mm · 0.83mm/px · 1 of 20 slices shown (14 of 23)]
[im 1/20]
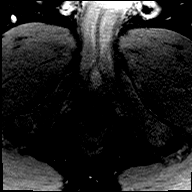

[Series 40: post t1_twist_tra_dyn-copy cent_sub_ttc=(id) · axial · 3.5mm · 0.83mm/px · 1 of 20 slices shown (13 of 22)]
[im 1/20]
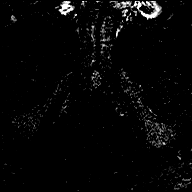

[Series 41: post t1_twist_tra_dyn-copy center · axial · 3.5mm · 0.83mm/px · 1 of 20 slices shown (15 of 23)]
[im 1/20]
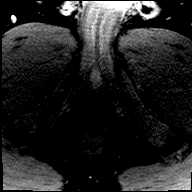

[Series 42: post t1_twist_tra_dyn-copy cent_sub_ttc=(id) · axial · 3.5mm · 0.83mm/px · 1 of 20 slices shown (14 of 22)]
[im 1/20]
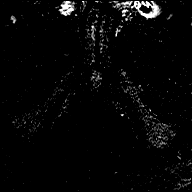

[Series 43: post t1_twist_tra_dyn-copy center · axial · 3.5mm · 0.83mm/px · 1 of 20 slices shown (16 of 23)]
[im 1/20]
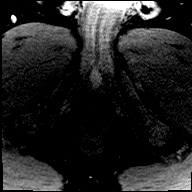

[Series 44: post t1_twist_tra_dyn-copy cent_sub_ttc=(id) · axial · 3.5mm · 0.83mm/px · 1 of 20 slices shown (15 of 22)]
[im 1/20]
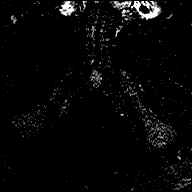

[Series 45: post t1_twist_tra_dyn-copy center · axial · 3.5mm · 0.83mm/px · 1 of 20 slices shown (17 of 23)]
[im 1/20]
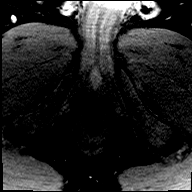

[Series 46: post t1_twist_tra_dyn-copy cent_sub_ttc=(id) · axial · 3.5mm · 0.83mm/px · 1 of 20 slices shown (16 of 22)]
[im 1/20]
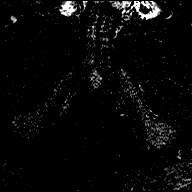

[Series 47: post t1_twist_tra_dyn-copy center · axial · 3.5mm · 0.83mm/px · 1 of 20 slices shown (18 of 23)]
[im 1/20]
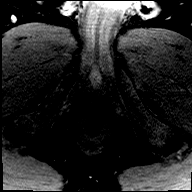

[Series 48: post t1_twist_tra_dyn-copy cent_sub_ttc=(id) · axial · 3.5mm · 0.83mm/px · 1 of 20 slices shown (17 of 22)]
[im 1/20]
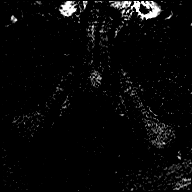

[Series 49: post t1_twist_tra_dyn-copy center · axial · 3.5mm · 0.83mm/px · 1 of 20 slices shown (19 of 23)]
[im 1/20]
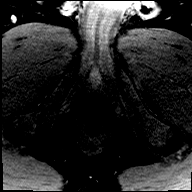

[Series 50: post t1_twist_tra_dyn-copy cent_sub_ttc=(id) · axial · 3.5mm · 0.83mm/px · 1 of 20 slices shown (18 of 22)]
[im 1/20]
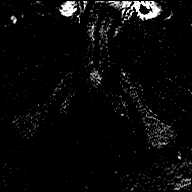

[Series 51: post t1_twist_tra_dyn-copy center · axial · 3.5mm · 0.83mm/px · 1 of 20 slices shown (20 of 23)]
[im 1/20]
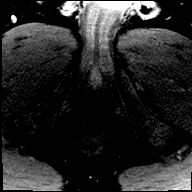

[Series 52: post t1_twist_tra_dyn-copy cent_sub_ttc=(id) · axial · 3.5mm · 0.83mm/px · 1 of 20 slices shown (19 of 22)]
[im 1/20]
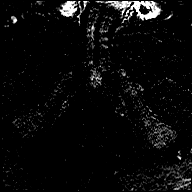

[Series 53: post t1_twist_tra_dyn-copy center · axial · 3.5mm · 0.83mm/px · 1 of 20 slices shown (21 of 23)]
[im 1/20]
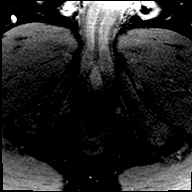

[Series 54: post t1_twist_tra_dyn-copy cent_sub_ttc=(id) · axial · 3.5mm · 0.83mm/px · 1 of 20 slices shown (20 of 22)]
[im 1/20]
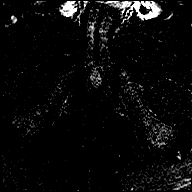

[Series 55: post t1_twist_tra_dyn-copy center · axial · 3.5mm · 0.83mm/px · 1 of 20 slices shown (22 of 23)]
[im 1/20]
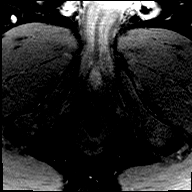

[Series 56: post t1_twist_tra_dyn-copy cent_sub_ttc=(id) · axial · 3.5mm · 0.83mm/px · 1 of 20 slices shown (21 of 22)]
[im 1/20]
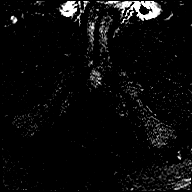

[Series 57: post t1_twist_tra_dyn-copy center · axial · 3.5mm · 0.83mm/px · 1 of 20 slices shown (23 of 23)]
[im 1/20]
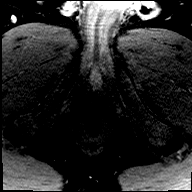

[Series 58: post t1_twist_tra_dyn-copy cent_sub_ttc=(id) · axial · 3.5mm · 0.83mm/px · 1 of 20 slices shown (22 of 22)]
[im 1/20]
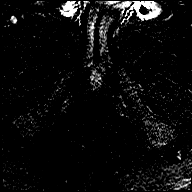

[56 of 56 positions shown; findings below may reference images not displayed]

FINDINGS: Prostate: No focal lesion in the peripheral zone on T2 weighted
imaging. TURP defect replaces the transitional zone.

The diffusion weighted images are degraded by a large gas artifact
projecting over the posterior aspect the gland generated from
extensive gas within the rectum. Maneuvers were performed prior to
imaging to evacuate gas from rectum. Maneuvers were not successful.

There is no enhancing lesion within the peripheral zone.
Transitional zone is near completely removed by the TURP resection.

Volume: 3.3 x 2.9 x 2.8 cm (volume = 14 cm^3)

Transcapsular spread:  Absent

Seminal vesicle involvement: Absent

Neurovascular bundle involvement: Absent

Pelvic adenopathy: Absent

Bone metastasis: None

Other findings: None
IMPRESSION: 1. No high-grade carcinoma identified on T2 weighted imaging. The
diffusion-weighted series are suboptimal due to gas within the
rectum (see discussion above). No abnormal contrast enhancement in
the prostate gland.
2. TURP defect places the central gland/transitional zone.

## 2022-07-12 ENCOUNTER — Encounter: Payer: Self-pay | Admitting: Urology

## 2022-07-12 ENCOUNTER — Ambulatory Visit (INDEPENDENT_AMBULATORY_CARE_PROVIDER_SITE_OTHER): Payer: Medicare PPO | Admitting: Urology

## 2022-07-12 VITALS — BP 125/80 | HR 64 | Ht 74.0 in | Wt 235.0 lb

## 2022-07-12 DIAGNOSIS — N400 Enlarged prostate without lower urinary tract symptoms: Secondary | ICD-10-CM | POA: Diagnosis not present

## 2022-07-12 DIAGNOSIS — C61 Malignant neoplasm of prostate: Secondary | ICD-10-CM

## 2022-07-12 LAB — URINALYSIS, ROUTINE W REFLEX MICROSCOPIC
Bilirubin, UA: NEGATIVE
Glucose, UA: NEGATIVE
Ketones, UA: NEGATIVE
Leukocytes,UA: NEGATIVE
Nitrite, UA: NEGATIVE
Protein,UA: NEGATIVE
Specific Gravity, UA: 1.025 (ref 1.005–1.030)
Urobilinogen, Ur: 0.2 mg/dL (ref 0.2–1.0)
pH, UA: 6 (ref 5.0–7.5)

## 2022-07-12 LAB — MICROSCOPIC EXAMINATION
Bacteria, UA: NONE SEEN
Cast Type: NONE SEEN
Casts: NONE SEEN /lpf
Crystal Type: NONE SEEN
Crystals: NONE SEEN
Renal Epithel, UA: NONE SEEN /hpf
Trichomonas, UA: NONE SEEN
Yeast, UA: NONE SEEN

## 2022-07-12 MED ORDER — TAMSULOSIN HCL 0.4 MG PO CAPS: 0.4000 mg | ORAL_CAPSULE | Freq: Every day | ORAL | 3 refills | Status: DC

## 2022-07-12 NOTE — Progress Notes (Signed)
   Assessment: 1. Benign prostatic hyperplasia, unspecified whether lower urinary tract symptoms present   2. Malignant neoplasm of prostate (HCC)     Plan: PSA today; will contact with results and recommendations Continue tamsulosin 0.4 mg daily.  Rx renewed Consider restarting 5ARI in future.  Prostate volume has doubled past 4 yrs. Continue yearly FU  Chief Complaint: BPH  HPI: Ronnie Campbell is a 67 y.o. male who presents for continued evaluation of a number of urologic issues. He was last seen by me 07/2019 He has cT1a prostate cancer dx at time of turp 2019. Pathology revealed focal (less than 1%) Gleason 3+3 equal 6 prostate cancer involving 1 chip. He also has h/o nephrolithiasis. S/p USM /LASER/DJ for 9mm left distal stone 01/2018.    Long prior h/o BPH with poor emptying (PVR's 400 range) Could not void today.  Bladder volume 290. H/o UTI 11/23.  No recurrent UTI  Currently on tamsulosin.  Minimal luts.  IPSS=3          PSA//MRI DATA: 05/2014   0.96 08/2016   1.38 04/2017  mp prostate MRI 04/2017:  No macroscopic lesions.  Prostate volume 14ml 04/2018   0.36 12/2018  0.40  07/2019  0.35 03/2020  0.50--stopped finasteride at this time and started on tamsulosin  05/2021  1.6 06/2021  Mp prostate MRI : no high grade lesions.  Prostate volume= 27ml  Portions of the above documentation were copied from a prior visit for review purposes only.  Allergies: Allergies  Allergen Reactions   Avelox [Moxifloxacin Hcl In Nacl] Rash    PMH: Past Medical History:  Diagnosis Date   Leukemia (HCC)    Leukemia (HCC) chronic leukemia    PSH: Past Surgical History:  Procedure Laterality Date   ARTHROSCOPY KNEE W/ DRILLING     COLONOSCOPY      SH: Social History   Tobacco Use   Smoking status: Never   Smokeless tobacco: Never  Substance Use Topics   Alcohol use: Yes   Drug use: No    ROS: Constitutional:  Negative for fever, chills, weight loss CV: Negative for  chest pain, previous MI, hypertension Respiratory:  Negative for shortness of breath, wheezing, sleep apnea, frequent cough GI:  Negative for nausea, vomiting, bloody stool, GERD  PE: BP 125/80   Pulse 64   Ht 6\' 2"  (1.88 m)   Wt 235 lb (106.6 kg)   BMI 30.17 kg/m  GENERAL APPEARANCE:  Well appearing, well developed, well nourished, NAD  DRE:  30gm.  No n/i

## 2022-07-12 NOTE — Addendum Note (Signed)
Addended by: Marcha Solders C on: 07/12/2022 10:30 AM   Modules accepted: Orders

## 2022-07-13 LAB — PSA: Prostate Specific Ag, Serum: 1.5 ng/mL (ref 0.0–4.0)

## 2023-03-14 ENCOUNTER — Ambulatory Visit: Payer: Medicare HMO | Admitting: Urology

## 2023-03-19 ENCOUNTER — Ambulatory Visit: Payer: Medicare HMO | Admitting: Urology

## 2023-03-19 ENCOUNTER — Encounter: Payer: Self-pay | Admitting: Urology

## 2023-03-19 VITALS — BP 125/73 | HR 58 | Ht 74.0 in | Wt 235.0 lb

## 2023-03-19 DIAGNOSIS — N400 Enlarged prostate without lower urinary tract symptoms: Secondary | ICD-10-CM

## 2023-03-19 DIAGNOSIS — C61 Malignant neoplasm of prostate: Secondary | ICD-10-CM | POA: Diagnosis not present

## 2023-03-19 LAB — URINALYSIS, ROUTINE W REFLEX MICROSCOPIC
Bilirubin, UA: NEGATIVE
Glucose, UA: NEGATIVE
Ketones, UA: NEGATIVE
Leukocytes,UA: NEGATIVE
Nitrite, UA: NEGATIVE
Protein,UA: NEGATIVE
RBC, UA: NEGATIVE
Specific Gravity, UA: 1.01 (ref 1.005–1.030)
Urobilinogen, Ur: 0.2 mg/dL (ref 0.2–1.0)
pH, UA: 6 (ref 5.0–7.5)

## 2023-03-19 MED ORDER — TAMSULOSIN HCL 0.4 MG PO CAPS: 0.4000 mg | ORAL_CAPSULE | Freq: Every day | ORAL | 3 refills | Status: AC

## 2023-03-19 NOTE — Addendum Note (Signed)
 Addended by: Carolin Coy on: 03/19/2023 10:21 AM   Modules accepted: Orders

## 2023-03-19 NOTE — Progress Notes (Signed)
   Assessment: 1. Malignant neoplasm of prostate (HCC)     Plan: Continue tamsulosin Psa today If stable will continue with yearly FU  Chief Complaint: Prostate cancer  HPI: Ronnie Campbell is a 68 y.o. male who presents for continued evaluation of BPH/LUTS and low risk CaP. He has cT1a prostate cancer dx at time of turp 2019. Pathology revealed focal (less than 1%) Gleason 3+3 equal 6 prostate cancer involving 1 chip. He also has h/o nephrolithiasis. S/p USM /LASER/DJ for 9mm left distal stone 01/2018.    Long prior h/o BPH with poor emptying (PVR's 400 range) Could not void today.   H/o UTI 11/23.  No recurrent UTI   Currently on tamsulosin.  Mild luts--ipss=6           PSA//MRI DATA: 05/2014              0.96 08/2016              1.38 04/2017             mp prostate MRI 04/2017:  No macroscopic lesions.  Prostate volume 14ml 04/2018              0.36 12/2018           0.40  07/2019             0.35 03/2020             0.50--stopped finasteride at this time and started on tamsulosin  05/2021             1.6 06/2021             Mp prostate MRI : no high grade lesions.  Prostate volume= 27ml 06/2022  1.5 02/2023    Portions of the above documentation were copied from a prior visit for review purposes only.  Allergies: Allergies  Allergen Reactions   Avelox [Moxifloxacin Hcl In Nacl] Rash    PMH: Past Medical History:  Diagnosis Date   Leukemia (HCC)    Leukemia (HCC) chronic leukemia    PSH: Past Surgical History:  Procedure Laterality Date   ARTHROSCOPY KNEE W/ DRILLING     COLONOSCOPY      SH: Social History   Tobacco Use   Smoking status: Never   Smokeless tobacco: Never  Substance Use Topics   Alcohol use: Yes   Drug use: No    ROS: Constitutional:  Negative for fever, chills, weight loss CV: Negative for chest pain, previous MI, hypertension Respiratory:  Negative for shortness of breath, wheezing, sleep apnea, frequent cough GI:  Negative for  nausea, vomiting, bloody stool, GERD  PE: Vitals:   03/19/23 0932  BP: 125/73  Pulse: (!) 58    GENERAL APPEARANCE:  Well appearing, well developed, well nourished, NAD  GU:  nl ext genitalia DRE:  nst, prostate 30 gm.  Small midline rubbery nodule at apex.

## 2023-03-20 ENCOUNTER — Encounter: Payer: Self-pay | Admitting: Urology

## 2023-03-20 LAB — PSA: Prostate Specific Ag, Serum: 1.7 ng/mL (ref 0.0–4.0)

## 2023-08-19 ENCOUNTER — Other Ambulatory Visit: Payer: Self-pay | Admitting: Urology

## 2023-08-22 ENCOUNTER — Encounter: Payer: Self-pay | Admitting: Urology

## 2023-08-22 ENCOUNTER — Ambulatory Visit: Admitting: Urology

## 2023-08-22 VITALS — BP 133/74 | HR 65 | Ht 74.0 in | Wt 238.0 lb

## 2023-08-22 DIAGNOSIS — N400 Enlarged prostate without lower urinary tract symptoms: Secondary | ICD-10-CM | POA: Insufficient documentation

## 2023-08-22 DIAGNOSIS — R3129 Other microscopic hematuria: Secondary | ICD-10-CM | POA: Insufficient documentation

## 2023-08-22 DIAGNOSIS — C61 Malignant neoplasm of prostate: Secondary | ICD-10-CM | POA: Insufficient documentation

## 2023-08-22 LAB — URINALYSIS, ROUTINE W REFLEX MICROSCOPIC
Bilirubin, UA: NEGATIVE
Glucose, UA: NEGATIVE
Ketones, UA: NEGATIVE
Leukocytes,UA: NEGATIVE
Nitrite, UA: NEGATIVE
Protein,UA: NEGATIVE
Specific Gravity, UA: 1.015 (ref 1.005–1.030)
Urobilinogen, Ur: 0.2 mg/dL (ref 0.2–1.0)
pH, UA: 5.5 (ref 5.0–7.5)

## 2023-08-22 LAB — MICROSCOPIC EXAMINATION: Bacteria, UA: NONE SEEN

## 2023-08-22 NOTE — Progress Notes (Signed)
 Assessment: 1. Malignant neoplasm of prostate (HCC); low risk; GG1; dx'd at time of TURP 2019   2. Benign prostatic hyperplasia, unspecified whether lower urinary tract symptoms present   3. Microscopic hematuria     Plan: I personally reviewed the patient's chart including provider notes, and lab results. I do not feel any significant change in the size of the patient's prostate as compared to prior exams. His lower urinary tract symptoms are well-controlled. Continue tamsulosin . Recommend repeat urinalysis in approximately 1 month due to the finding of microscopic hematuria. I discussed the potential need for further evaluation if the microscopic hematuria persists.  Chief Complaint: Chief Complaint  Patient presents with   Prostate Cancer    HPI: Ronnie Campbell is a 68 y.o. male who presents for continued evaluation of BPH with lower urinary tract symptoms and low risk prostate cancer. He was previously followed by Dr. Shona and was last seen in February 2025. He has cT1a prostate cancer diagnosed at the time of a TURP in 2019.  Pathology revealed focal (<1%) Gleason 3+3 prostate cancer involving 1 chip.  PSA//MRI DATA: 05/2014              0.96 08/2016              1.38 04/2017               mp prostate MRI 04/2017:  No macroscopic lesions.  Prostate volume 14ml 04/2018               0.36 12/2018             0.40  07/2019               0.35 03/2020               0.50--stopped finasteride at this time and started on tamsulosin   05/2021               1.6 06/2021               Mp prostate MRI : no high grade lesions.  Prostate volume= 27ml 06/2022     1.5 02/2023     1.7  Long history of BPH with poor emptying with PVRs in the 400 mL range.  He is currently managed with tamsulosin . IPSS = 6 at his visit in 2/25.  He also has a history of nephrolithiasis and is status post laser lithotripsy for a 9 mm distal left ureteral stone in January 2020.  He presents today for evaluation due  to concerns about the size of his prostate noted on recent colonoscopy.  He was apparently told by the gastroenterologist that his prostate was enlarged.  He has not had any recent change in his urinary symptoms.  He continues on tamsulosin .  His lower urinary tract symptoms are fairly well-controlled.  No dysuria or gross hematuria. IPSS = 8/2.  Portions of the above documentation were copied from a prior visit for review purposes only.  Allergies: Allergies  Allergen Reactions   Avelox [Moxifloxacin Hcl In Nacl] Rash    PMH: Past Medical History:  Diagnosis Date   Leukemia (HCC)    Leukemia (HCC) chronic leukemia    PSH: Past Surgical History:  Procedure Laterality Date   ARTHROSCOPY KNEE W/ DRILLING     COLONOSCOPY      SH: Social History   Tobacco Use   Smoking status: Never   Smokeless tobacco: Never  Substance Use Topics   Alcohol  use: Yes   Drug use: No    ROS: Constitutional:  Negative for fever, chills, weight loss CV: Negative for chest pain, previous MI, hypertension Respiratory:  Negative for shortness of breath, wheezing, sleep apnea, frequent cough GI:  Negative for nausea, vomiting, bloody stool, GERD  PE: BP 133/74   Pulse 65   Ht 6' 2 (1.88 m)   Wt 238 lb (108 kg)   BMI 30.56 kg/m  GENERAL APPEARANCE:  Well appearing, well developed, well nourished, NAD HEENT:  Atraumatic, normocephalic, oropharynx clear NECK:  Supple without lymphadenopathy or thyromegaly ABDOMEN:  Soft, non-tender, no masses EXTREMITIES:  Moves all extremities well, without clubbing, cyanosis, or edema NEUROLOGIC:  Alert and oriented x 3, normal gait, CN II-XII grossly intact MENTAL STATUS:  appropriate BACK:  Non-tender to palpation, No CVAT SKIN:  Warm, dry, and intact GU: Prostate: 30 g, midline nodule, NT Rectum: Normal tone,  no masses or tenderness    Results: U/A: 0-5 WBCs, 3-10 RBCs

## 2023-08-23 ENCOUNTER — Telehealth: Payer: Self-pay | Admitting: Urology

## 2023-08-23 NOTE — Telephone Encounter (Signed)
 Error

## 2023-08-27 ENCOUNTER — Other Ambulatory Visit: Payer: Self-pay

## 2023-08-27 DIAGNOSIS — R3129 Other microscopic hematuria: Secondary | ICD-10-CM

## 2023-09-12 ENCOUNTER — Other Ambulatory Visit

## 2023-09-12 ENCOUNTER — Ambulatory Visit: Payer: Self-pay | Admitting: Urology

## 2023-09-12 DIAGNOSIS — R3129 Other microscopic hematuria: Secondary | ICD-10-CM

## 2023-09-12 LAB — MICROSCOPIC EXAMINATION

## 2023-09-12 LAB — URINALYSIS, ROUTINE W REFLEX MICROSCOPIC
Bilirubin, UA: NEGATIVE
Glucose, UA: NEGATIVE
Ketones, UA: NEGATIVE
Leukocytes,UA: NEGATIVE
Nitrite, UA: NEGATIVE
Protein,UA: NEGATIVE
Specific Gravity, UA: 1.01 (ref 1.005–1.030)
Urobilinogen, Ur: 0.2 mg/dL (ref 0.2–1.0)
pH, UA: 6.5 (ref 5.0–7.5)
